# Patient Record
Sex: Male | Born: 1985 | Hispanic: Yes | Marital: Married | Smoking: Never smoker
Health system: Southern US, Community
[De-identification: ages and names within clinical notes are randomized; demographics above are authoritative.]

---

## 2008-12-03 ENCOUNTER — Emergency Department: Payer: Self-pay | Admitting: Unknown Physician Specialty

## 2017-03-20 ENCOUNTER — Emergency Department
Admission: EM | Admit: 2017-03-20 | Discharge: 2017-03-20 | Disposition: A | Discharge status: Home / Self Care | Payer: No Typology Code available for payment source | Attending: Emergency Medicine | Admitting: Emergency Medicine

## 2017-03-20 ENCOUNTER — Encounter: Payer: Self-pay | Admitting: *Deleted

## 2017-03-20 ENCOUNTER — Emergency Department: Payer: No Typology Code available for payment source

## 2017-03-20 DIAGNOSIS — S161XXA Strain of muscle, fascia and tendon at neck level, initial encounter: Secondary | ICD-10-CM | POA: Insufficient documentation

## 2017-03-20 DIAGNOSIS — Y939 Activity, unspecified: Secondary | ICD-10-CM | POA: Diagnosis not present

## 2017-03-20 DIAGNOSIS — Y929 Unspecified place or not applicable: Secondary | ICD-10-CM | POA: Diagnosis not present

## 2017-03-20 DIAGNOSIS — Y999 Unspecified external cause status: Secondary | ICD-10-CM | POA: Insufficient documentation

## 2017-03-20 DIAGNOSIS — S199XXA Unspecified injury of neck, initial encounter: Secondary | ICD-10-CM | POA: Diagnosis present

## 2017-03-20 MED ORDER — IBUPROFEN 800 MG PO TABS
800.0000 mg | ORAL_TABLET | Freq: Once | ORAL | Status: AC
  Administered 2017-03-20: 800 mg via ORAL
  Filled 2017-03-20: qty 1

## 2017-03-20 NOTE — Discharge Instructions (Signed)
You may take over-the-counter ibuprofen as needed for symptoms, as directed on the medication bottle. You may also use cold or heat therapy for symptom relief as well. If symptoms significantly worsen do not hesitate to return to the emergency department.
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
## 2021-03-26 ENCOUNTER — Other Ambulatory Visit: Payer: Self-pay

## 2021-03-26 ENCOUNTER — Emergency Department: Payer: No Typology Code available for payment source

## 2021-03-26 ENCOUNTER — Emergency Department
Admission: EM | Admit: 2021-03-26 | Discharge: 2021-03-27 | Disposition: A | Discharge status: Home / Self Care | Payer: No Typology Code available for payment source | Attending: Student in an Organized Health Care Education/Training Program | Admitting: Student in an Organized Health Care Education/Training Program

## 2021-03-26 DIAGNOSIS — E860 Dehydration: Secondary | ICD-10-CM | POA: Diagnosis not present

## 2021-03-26 DIAGNOSIS — Z72820 Sleep deprivation: Secondary | ICD-10-CM | POA: Insufficient documentation

## 2021-03-26 DIAGNOSIS — R531 Weakness: Secondary | ICD-10-CM | POA: Diagnosis not present

## 2021-03-26 DIAGNOSIS — R55 Syncope and collapse: Secondary | ICD-10-CM | POA: Diagnosis present

## 2021-03-26 LAB — COMPREHENSIVE METABOLIC PANEL
ALT: 18 U/L (ref 0–44)
AST: 25 U/L (ref 15–41)
Albumin: 4.8 g/dL (ref 3.5–5.0)
Alkaline Phosphatase: 46 U/L (ref 38–126)
Anion gap: 8 (ref 5–15)
BUN: 13 mg/dL (ref 6–20)
CO2: 27 mmol/L (ref 22–32)
Calcium: 9.4 mg/dL (ref 8.9–10.3)
Chloride: 103 mmol/L (ref 98–111)
Creatinine, Ser: 1.09 mg/dL (ref 0.61–1.24)
GFR, Estimated: >60 mL/min (ref >60)
Glucose, Bld: 111 mg/dL — ABNORMAL HIGH (ref 70–99)
Potassium: 3.9 mmol/L (ref 3.5–5.1)
Sodium: 138 mmol/L (ref 135–145)
Total Bilirubin: 1.4 mg/dL — ABNORMAL HIGH (ref 0.3–1.2)
Total Protein: 7.8 g/dL (ref 6.5–8.1)

## 2021-03-26 LAB — CBC WITH DIFFERENTIAL/PLATELET
Abs Immature Granulocytes: 0.02 10*3/uL (ref 0.00–0.07)
Basophils Absolute: 0 10*3/uL (ref 0.0–0.1)
Basophils Relative: 0 %
Eosinophils Absolute: 0 10*3/uL (ref 0.0–0.5)
Eosinophils Relative: 0 %
HCT: 43.7 % (ref 39.0–52.0)
Hemoglobin: 15.9 g/dL (ref 13.0–17.0)
Immature Granulocytes: 0 %
Lymphocytes Relative: 23 %
Lymphs Abs: 1.6 10*3/uL (ref 0.7–4.0)
MCH: 30.6 pg (ref 26.0–34.0)
MCHC: 36.4 g/dL — ABNORMAL HIGH (ref 30.0–36.0)
MCV: 84 fL (ref 80.0–100.0)
Monocytes Absolute: 0.4 10*3/uL (ref 0.1–1.0)
Monocytes Relative: 5 %
Neutro Abs: 4.8 10*3/uL (ref 1.7–7.7)
Neutrophils Relative %: 72 %
Platelets: 268 10*3/uL (ref 150–400)
RBC: 5.2 MIL/uL (ref 4.22–5.81)
RDW: 11.8 % (ref 11.5–15.5)
WBC: 6.8 10*3/uL (ref 4.0–10.5)
nRBC: 0 % (ref 0.0–0.2)

## 2021-03-26 LAB — TROPONIN I (HIGH SENSITIVITY)
Troponin I (High Sensitivity): <2 ng/L (ref <18)
Troponin I (High Sensitivity): <2 ng/L (ref <18)

## 2021-03-26 MED ORDER — SODIUM CHLORIDE 0.9 % IV BOLUS
1000.0000 mL | Freq: Once | INTRAVENOUS | Status: AC
  Administered 2021-03-27: 1000 mL via INTRAVENOUS

## 2021-03-26 NOTE — ED Triage Notes (Signed)
Pt to ER via POV. Reports having a syncopal episode after urinating on Saturday and has been feeling dizzy since. Reports alcohol use on Saturday. Reports falling. Denies hitting head. No blood thinner usage. Denies CP.

## 2021-03-26 NOTE — ED Provider Notes (Signed)
Emergency Medicine Provider Triage Evaluation Note  Troy Short , a 35 y.o. male  was evaluated in triage.  Pt complains of lightheadedness as well as dizzy nests.  States he had episodes in the morning where he nearly fainted.  Does endorse drinking alcohol over the weekend.  Denies any palpitations or chest pain.  No shortness of breath.  No nausea or vomiting.  No numbness or tingling.  Did not strike his head.  Review of Systems  Positive: dizziness Negative: Chest pain  Physical Exam  BP (!) 154/105   Pulse 89   Temp 98.9 F (37.2 C) (Oral)   Resp 16   Ht 5\' 5"  (1.651 m)   Wt 68 kg   SpO2 99%   BMI 24.96 kg/m  Gen:   Awake, no distress   Resp:  Normal effort  MSK:   Moves extremities without difficulty  Other:    Medical Decision Making  Medically screening exam initiated at 4:32 PM.  Appropriate orders placed.  Troy Short was informed that the remainder of the evaluation will be completed by another provider, this initial triage assessment does not replace that evaluation, and the importance of remaining in the ED until their evaluation is complete.     Leticia Penna, MD 03/26/21 [phone]

## 2021-03-26 NOTE — ED Provider Notes (Signed)
[facility] Emergency Department Provider Note   ____________________________________________   Event Date/Time   First MD Initiated Contact with Patient 03/26/21 2311     (approximate)  I have reviewed the triage vital signs and the nursing notes.   HISTORY  Chief Complaint Loss of Consciousness    HPI Troy Short is a 35 y.o. male referred from the walk-in clinic to the ED for evaluation of syncope.  Patient reports heavy EtOH use on Saturday.  He got up in the middle of the night with a strong urge to urinate.  Felt lightheaded while emptying his bladder and had a brief, 2-second syncopal episode after urinating.  Denies injury.  Wife heard him immediately and helped him back to bed.  Reports feeling fine since but wife urged him to seek evaluation at the walk-in clinic.  Mentions he has not been sleeping well for the past 2 weeks because his young daughters have been sick.  Denies fever, cough, chest pain, shortness of breath, abdominal pain, nausea or vomiting.  Denies anticoagulant use.     Past medical history None  There are no problems to display for this patient.   History reviewed. No pertinent surgical history.  Prior to Admission medications   Not on File    Allergies Patient has no known allergies.  No family history on file.  Social History Social History   Tobacco Use   Smoking status: Never   Smokeless tobacco: Never  Substance Use Topics   Alcohol use: Yes    Review of Systems  Constitutional: No fever/chills Eyes: No visual changes. ENT: No sore throat. Cardiovascular: Denies chest pain. Respiratory: Denies shortness of breath. Gastrointestinal: No abdominal pain.  No nausea, no vomiting.  No diarrhea.  No constipation. Genitourinary: Negative for dysuria. Musculoskeletal: Negative for back pain. Skin: Negative for rash. Neurological: Positive for syncope.  Negative for headaches, focal weakness or  numbness.   ____________________________________________   PHYSICAL EXAM:  VITAL SIGNS: ED Triage Vitals  Enc Vitals Group     BP 03/26/21 1630 (!) 154/105     Pulse Rate 03/26/21 1630 89     Resp 03/26/21 1630 16     Temp 03/26/21 1630 98.9 F (37.2 C)     Temp Source 03/26/21 1630 Oral     SpO2 03/26/21 1630 99 %     Weight 03/26/21 1631 150 lb (68 kg)     Height 03/26/21 1631 5\' 5"  (1.651 m)     Head Circumference --      Peak Flow --      Pain Score 03/26/21 1631 0     Pain Loc --      Pain Edu? --      Excl. in GC? --     Constitutional: Alert and oriented. Well appearing and in no acute distress. Eyes: Conjunctivae are normal. PERRL. EOMI. Eyes appear sunken. Head: Atraumatic. Nose: Atraumatic. Mouth/Throat: Mucous membranes are mildly dry.   Neck: No stridor.  No cervical spine tenderness to palpation.  No carotid bruits. Cardiovascular: Normal rate, regular rhythm. Grossly normal heart sounds.  Good peripheral circulation. Respiratory: Normal respiratory effort.  No retractions. Lungs CTAB. Gastrointestinal: Soft and nontender to light or deep palpation. No distention. No abdominal bruits. No CVA tenderness. Musculoskeletal: No lower extremity tenderness nor edema.  No joint effusions. Neurologic: Alert and oriented x3.  CN II to XII grossly intact.  Normal speech and language. No gross focal neurologic deficits are appreciated. No gait  instability. Skin:  Skin is warm, dry and intact. No rash noted.  No petechiae. Psychiatric: Mood and affect are normal. Speech and behavior are normal.  ____________________________________________   LABS (all labs ordered are listed, but only abnormal results are displayed)  Labs Reviewed  CBC WITH DIFFERENTIAL/PLATELET - Abnormal; Notable for the following components:      Result Value   MCHC 36.4 (*)    All other components within normal limits  COMPREHENSIVE METABOLIC PANEL - Abnormal; Notable for the following  components:   Glucose, Bld 111 (*)    Total Bilirubin 1.4 (*)    All other components within normal limits  TROPONIN I (HIGH SENSITIVITY)  TROPONIN I (HIGH SENSITIVITY)   ____________________________________________  EKG  ED ECG REPORT I, Annsleigh Dragoo J, the attending physician, personally viewed and interpreted this ECG.   Date: 03/27/2021  EKG Time: 1633  Rate: 88  Rhythm: normal sinus rhythm  Axis: Normal  Intervals:none  ST&T Change: Nonspecific  ____________________________________________  RADIOLOGY I, Ziyana Morikawa J, personally viewed and evaluated these images (plain radiographs) as part of my medical decision making, as well as reviewing the written report by the radiologist.  ED MD interpretation: No acute cardiopulmonary process  Official radiology report(s): DG Chest Portable 1 View  Result Date: 03/26/2021 CLINICAL DATA:  Dizziness EXAM: PORTABLE CHEST 1 VIEW COMPARISON:  None. FINDINGS: Cardiac and mediastinal contours are within normal limits. No focal pulmonary opacity. No pleural effusion or pneumothorax. No acute osseous abnormality. IMPRESSION: No acute cardiopulmonary process. Electronically Signed   By: Wiliam Ke M.D.   On: 03/26/2021 17:45    ____________________________________________   PROCEDURES  Procedure(s) performed (including Critical Care):  .1-3 Lead EKG Interpretation Performed by: Irean Hong, MD Authorized by: Irean Hong, MD     Interpretation: normal     ECG rate:  88   ECG rate assessment: normal     Rhythm: sinus rhythm     Ectopy: none     Conduction: normal   Comments:     Placed on monitor to evaluate for arrhythmias   ____________________________________________   INITIAL IMPRESSION / ASSESSMENT AND PLAN / ED COURSE  As part of my medical decision making, I reviewed the following data within the electronic MEDICAL RECORD NUMBER Nursing notes reviewed and incorporated, Labs reviewed, EKG interpreted, Old chart reviewed,  Radiograph reviewed, and Notes from prior ED visits     35 year old male presenting status post micturition syncope 4 days ago.  Differential diagnosis includes but is not limited to TIA, CVA, ACS, infectious, metabolic etiologies, etc.  Patient is neurologically intact without focal deficits.  Laboratory results and EKG unremarkable.  Will perform orthostatic vital signs.  Clinically appears mildly dehydrated; will initiate IV fluid resuscitation.  Will reassess.  Clinical Course as of 03/27/21 0142  Wed Mar 27, 2021  5852 Patient "feels like a new man".  Strict return precautions given.  Patient verbalizes understanding and agrees with plan of care. [JS]    Clinical Course User Index [JS] Irean Hong, MD     ____________________________________________   FINAL CLINICAL IMPRESSION(S) / ED DIAGNOSES  Final diagnoses:  Weakness  Dehydration  Sleep deprivation  Micturition syncope     ED Discharge Orders     None        Note:  This document was prepared using Dragon voice recognition software and may include unintentional dictation errors.    Irean Hong, MD 03/27/21 [phone]

## 2021-03-27 NOTE — Discharge Instructions (Signed)
Drink plenty of fluids daily.  Return to the ER for worsening symptoms, persistent vomiting, difficulty breathing or other concerns. °

## 2021-06-14 ENCOUNTER — Other Ambulatory Visit: Payer: Self-pay | Admitting: Physician Assistant

## 2021-06-14 DIAGNOSIS — R519 Headache, unspecified: Secondary | ICD-10-CM

## 2021-06-14 DIAGNOSIS — H538 Other visual disturbances: Secondary | ICD-10-CM

## 2021-06-14 DIAGNOSIS — R42 Dizziness and giddiness: Secondary | ICD-10-CM

## 2021-07-02 ENCOUNTER — Ambulatory Visit
Admission: RE | Admit: 2021-07-02 | Discharge: 2021-07-02 | Disposition: A | Discharge status: Home / Self Care | Payer: No Typology Code available for payment source | Source: Ambulatory Visit | Attending: Physician Assistant | Admitting: Physician Assistant

## 2021-07-02 ENCOUNTER — Other Ambulatory Visit: Payer: Self-pay

## 2021-07-02 DIAGNOSIS — R519 Headache, unspecified: Secondary | ICD-10-CM | POA: Diagnosis not present

## 2021-07-02 DIAGNOSIS — R42 Dizziness and giddiness: Secondary | ICD-10-CM | POA: Diagnosis present

## 2021-07-02 DIAGNOSIS — H538 Other visual disturbances: Secondary | ICD-10-CM | POA: Diagnosis present

## 2021-07-02 MED ORDER — GADOBUTROL 1 MMOL/ML IV SOLN
6.0000 mL | Freq: Once | INTRAVENOUS | Status: AC | PRN
  Administered 2021-07-02: 6 mL via INTRAVENOUS

## 2022-01-28 IMAGING — MR MR HEAD WO/W CM
13 series · 48 of 48 positions shown · IV contrast (6ml Gadavist)
Comparison: CT head[REDACTED], 3808.

CLINICAL DATA: Headache disorder EB8.S (8UL-4K-CM)

Dizziness R42 (8UL-4K-CM)
blurred vision YHT.3 (8UL-4K-CM)
EXAM:
MRI HEAD WITHOUT AND WITH CONTRAST
TECHNIQUE: Multiplanar, multiecho pulse sequences of the brain and surrounding
structures were obtained without and with intravenous contrast.
CONTRAST:  6mL GADAVIST GADOBUTROL 1 MMOL/ML IV SOLN

[Series 5: ax dwi_tracew · axial · 3.0mm · 0.65mm/px · z∈[-90,+64]mm · 4 of 48 slices shown]
[im 1/48]
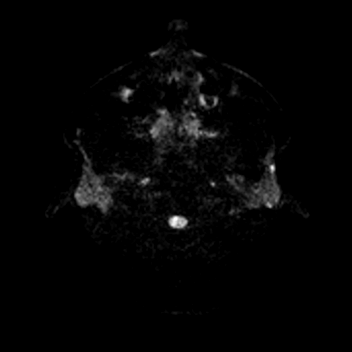
[im 16/48]
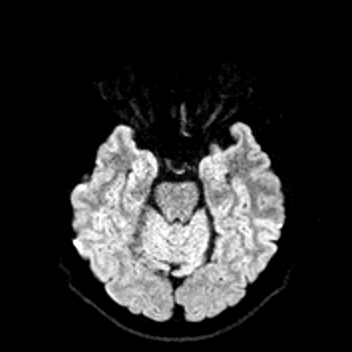
[im 32/48]
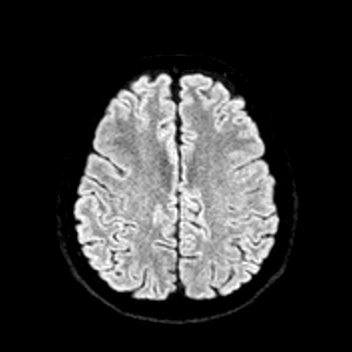
[im 48/48]
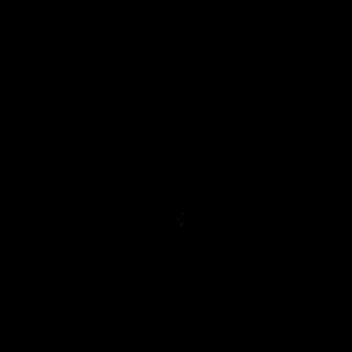

[Series 6: ax dwi_adc · axial · 3.0mm · 0.65mm/px · z∈[-90,+61]mm · 3 of 47 slices shown]
[im 1/47]
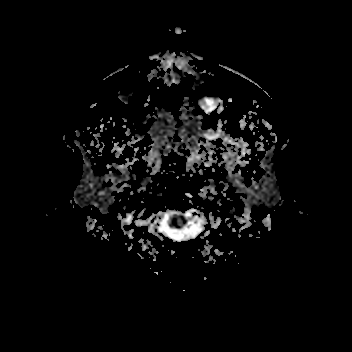
[im 24/47]
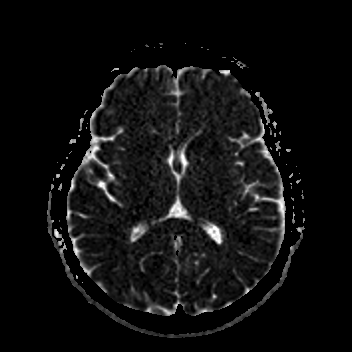
[im 47/47]
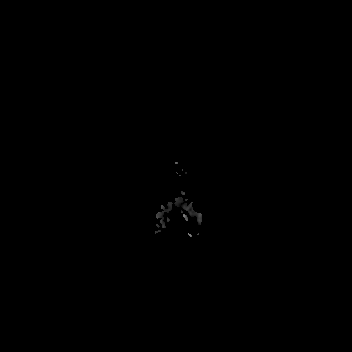

[Series 7: cor dwi_tracew · coronal · 5.0mm · 0.65mm/px · 2 of 40 slices shown]
[im 1/40]
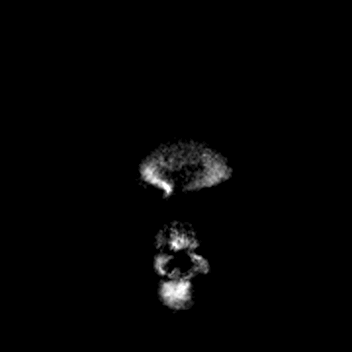
[im 40/40]
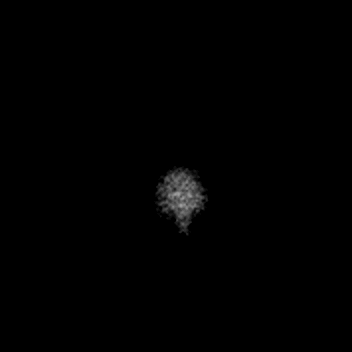

[Series 8: cor dwi_adc · coronal · 5.0mm · 0.65mm/px · 2 of 39 slices shown]
[im 1/39]
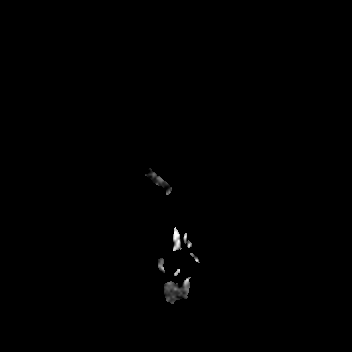
[im 39/39]
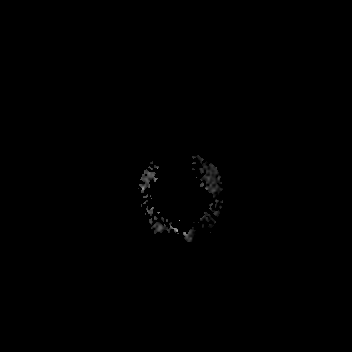

[Series 9: T1 · sagittal · 5.0mm · 0.62mm/px · 1 of 25 slices shown (1 of 2)]
[im 1/25]
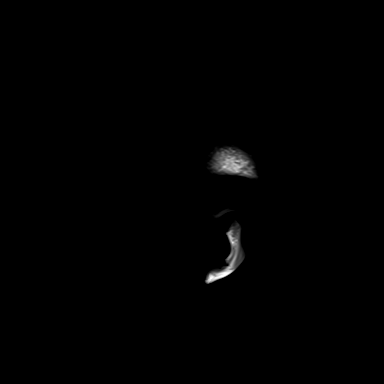

[Series 10: T2 · axial · 5.0mm · 0.53mm/px · z∈[-87,+63]mm · 2 of 26 slices shown]
[im 1/26]
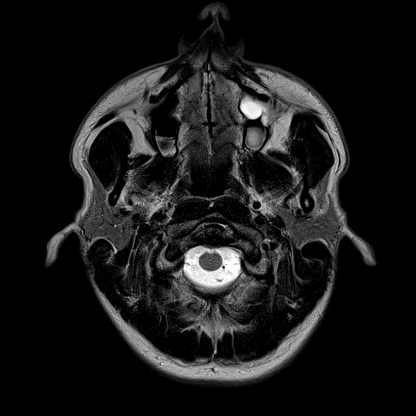
[im 26/26]
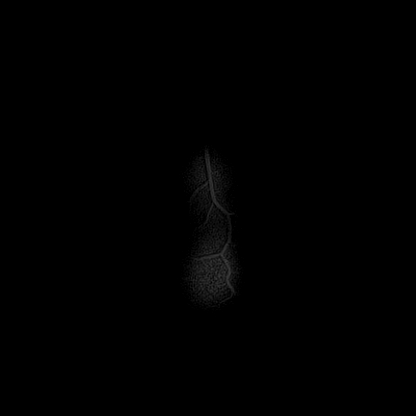

[Series 12: pha_images · axial · 3.0mm · 0.90mm/px · z∈[-100,+76]mm · 3 of 58 slices shown]
[im 1/58]
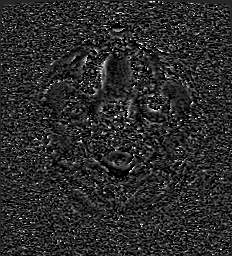
[im 29/58]
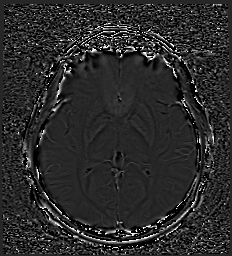
[im 58/58]
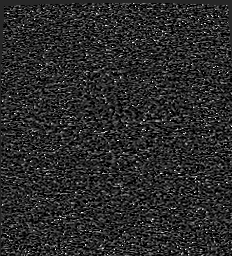

[Series 13: swi_images · axial · 3.0mm · 0.90mm/px · z∈[-100,+76]mm · 4 of 60 slices shown]
[im 1/60]
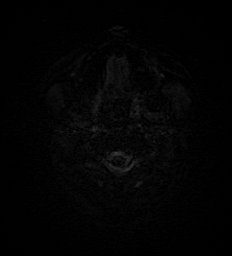
[im 20/60]
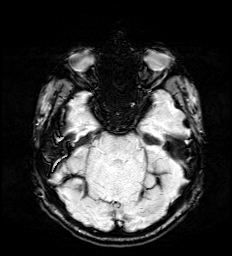
[im 40/60]
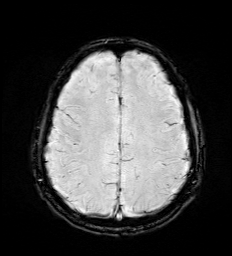
[im 60/60]
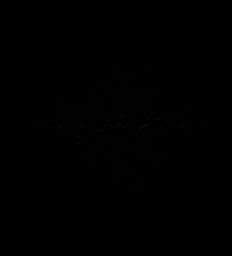

[Series 15: FLAIR · axial · 3.0mm · 0.53mm/px · z∈[-93,+69]mm · 3 of 55 slices shown]
[im 1/55]
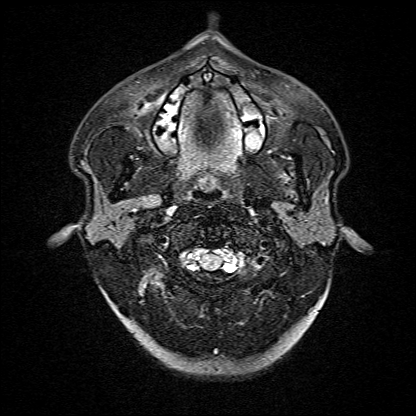
[im 28/55]
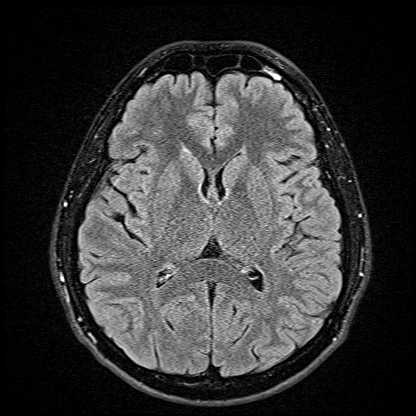
[im 55/55]
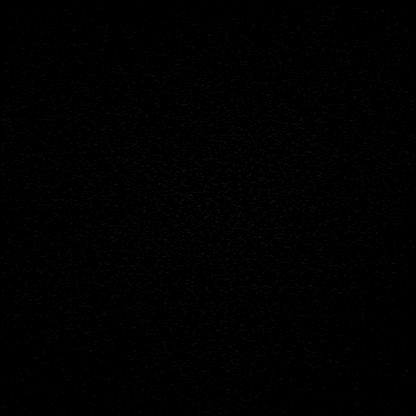

[Series 16: T1 · axial · 1.0mm · 0.98mm/px · z∈[-101,+73]mm · 10 of 175 slices shown (2 of 2)]
[im 1/175]
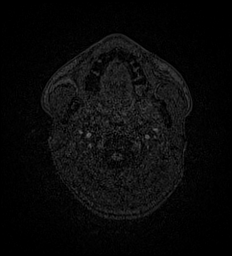
[im 20/175]
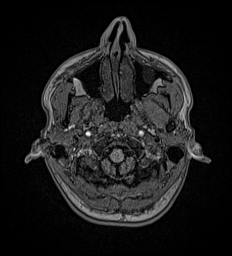
[im 39/175]
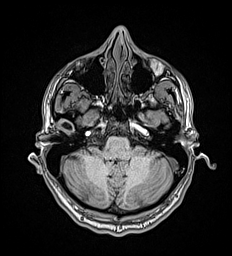
[im 59/175]
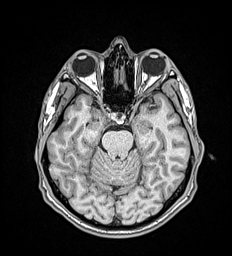
[im 78/175]
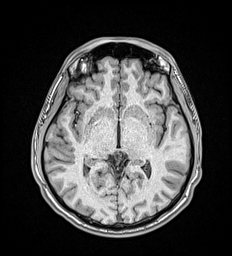
[im 97/175]
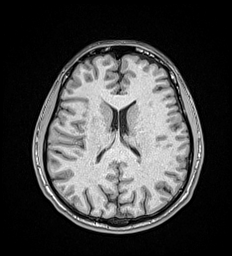
[im 117/175]
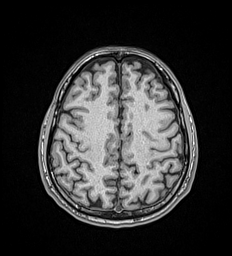
[im 136/175]
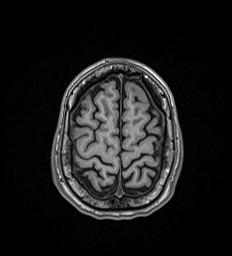
[im 155/175]
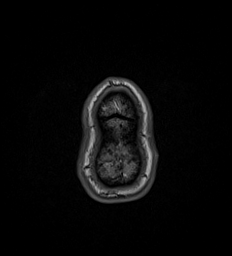
[im 175/175]
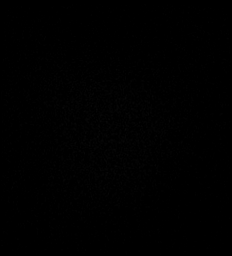

[Series 17: T2 post-contrast · coronal · 5.0mm · 0.57mm/px · 2 of 30 slices shown]
[im 1/30]
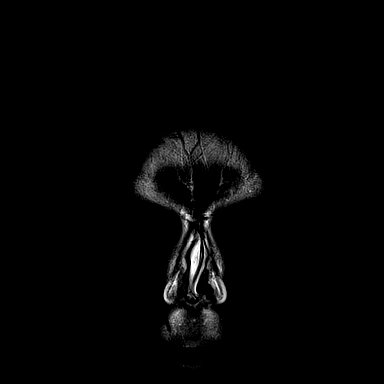
[im 30/30]
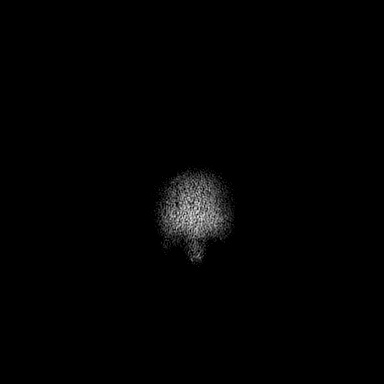

[Series 18: T1 post-contrast · axial · 1.0mm · 0.98mm/px · z∈[-101,+73]mm · 10 of 175 slices shown (1 of 2)]
[im 1/175]
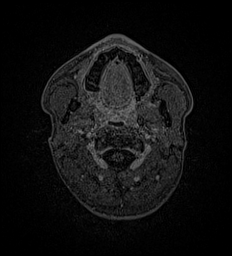
[im 20/175]
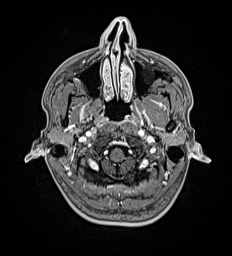
[im 39/175]
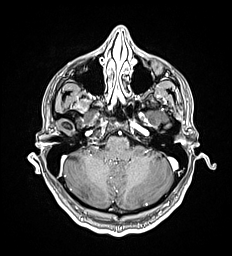
[im 59/175]
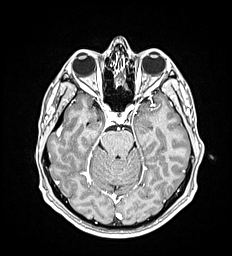
[im 78/175]
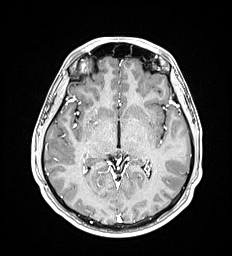
[im 97/175]
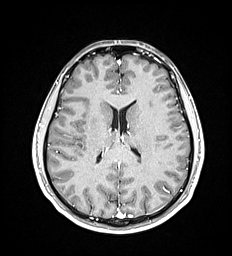
[im 117/175]
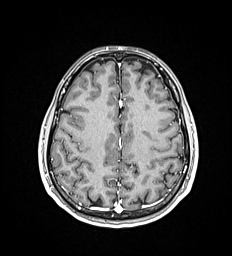
[im 136/175]
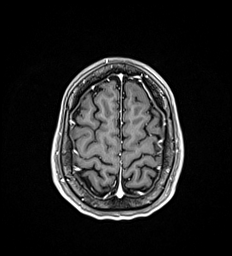
[im 155/175]
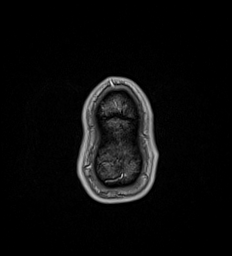
[im 175/175]
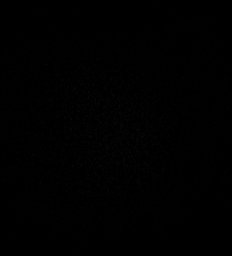

[Series 19: T1 post-contrast · coronal · 5.0mm · 0.57mm/px · 2 of 30 slices shown (2 of 2)]
[im 1/30]
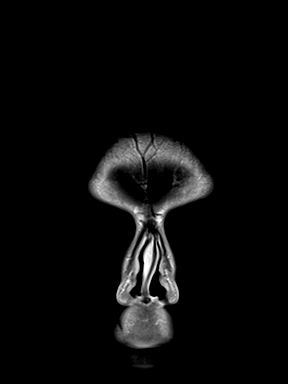
[im 30/30]
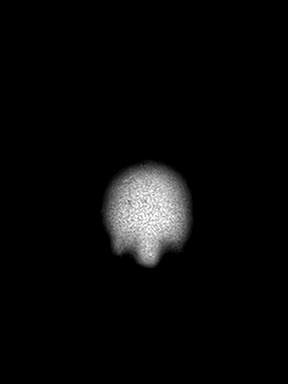

[48 of 48 positions shown; findings below may reference images not displayed]

FINDINGS: Brain: No acute infarction, hemorrhage, hydrocephalus, extra-axial
collection or mass lesion. No abnormal enhancement.

Vascular: Major arterial flow voids are maintained at the skull
base.

Skull and upper cervical spine: Normal marrow signal.

Sinuses/Orbits: Inferior left maxillary sinus retention cysts.
Otherwise, clear sinuses. Unremarkable orbits.

Other: No mastoid effusions.
IMPRESSION: Normal brain MRI.  No acute abnormality.
